# Patient Record
Sex: Male | Born: 1970 | Hispanic: Yes | Marital: Married | Smoking: Never smoker
Health system: Southern US, Community
[De-identification: ages and names within clinical notes are randomized; demographics above are authoritative.]

---

## 2009-05-03 ENCOUNTER — Emergency Department (HOSPITAL_COMMUNITY): Admission: EM | Admit: 2009-05-03 | Discharge: 2009-05-04 | Payer: Self-pay | Admitting: Emergency Medicine

## 2010-12-24 LAB — COMPREHENSIVE METABOLIC PANEL
ALT: 24 U/L (ref 0–53)
AST: 27 U/L (ref 0–37)
Albumin: 3.9 g/dL (ref 3.5–5.2)
CO2: 27 mEq/L (ref 19–32)
Chloride: 107 mEq/L (ref 96–112)
GFR calc Af Amer: >60 mL/min (ref >60)
GFR calc non Af Amer: >60 mL/min (ref >60)
Sodium: 141 mEq/L (ref 135–145)
Total Bilirubin: 1.2 mg/dL (ref 0.3–1.2)

## 2010-12-24 LAB — URINE MICROSCOPIC-ADD ON

## 2010-12-24 LAB — DIFFERENTIAL
Eosinophils Absolute: 1.1 10*3/uL — ABNORMAL HIGH (ref 0.0–0.7)
Eosinophils Relative: 14 % — ABNORMAL HIGH (ref 0–5)
Lymphocytes Relative: 25 % (ref 12–46)
Lymphs Abs: 2 10*3/uL (ref 0.7–4.0)
Monocytes Absolute: 0.5 10*3/uL (ref 0.1–1.0)

## 2010-12-24 LAB — URINALYSIS, ROUTINE W REFLEX MICROSCOPIC
Bilirubin Urine: NEGATIVE
Hgb urine dipstick: NEGATIVE
Ketones, ur: NEGATIVE mg/dL
Nitrite: NEGATIVE
Specific Gravity, Urine: 1.041 — ABNORMAL HIGH (ref 1.005–1.030)
pH: 5.5 (ref 5.0–8.0)

## 2010-12-24 LAB — CBC
Platelets: 227 10*3/uL (ref 150–400)
RBC: 4.59 MIL/uL (ref 4.22–5.81)
WBC: 8 10*3/uL (ref 4.0–10.5)

## 2010-12-24 LAB — LIPASE, BLOOD: Lipase: 22 U/L (ref 11–59)

## 2011-03-30 ENCOUNTER — Ambulatory Visit
Admission: RE | Admit: 2011-03-30 | Discharge: 2011-03-30 | Disposition: A | Discharge status: Home / Self Care | Payer: Self-pay | Source: Ambulatory Visit | Attending: Geriatric Medicine | Admitting: Geriatric Medicine

## 2011-03-30 ENCOUNTER — Other Ambulatory Visit: Payer: Self-pay | Admitting: Geriatric Medicine

## 2011-03-30 DIAGNOSIS — T1490XA Injury, unspecified, initial encounter: Secondary | ICD-10-CM

## 2015-02-12 ENCOUNTER — Encounter (HOSPITAL_COMMUNITY): Payer: Self-pay | Admitting: *Deleted

## 2015-02-12 ENCOUNTER — Emergency Department (HOSPITAL_COMMUNITY)
Admission: EM | Admit: 2015-02-12 | Discharge: 2015-02-12 | Disposition: A | Discharge status: Home / Self Care | Payer: Self-pay | Attending: Emergency Medicine | Admitting: Emergency Medicine

## 2015-02-12 DIAGNOSIS — S21159A Open bite of unspecified front wall of thorax without penetration into thoracic cavity, initial encounter: Secondary | ICD-10-CM | POA: Insufficient documentation

## 2015-02-12 DIAGNOSIS — Y990 Civilian activity done for income or pay: Secondary | ICD-10-CM | POA: Insufficient documentation

## 2015-02-12 DIAGNOSIS — Y9289 Other specified places as the place of occurrence of the external cause: Secondary | ICD-10-CM | POA: Insufficient documentation

## 2015-02-12 DIAGNOSIS — S2195XA Open bite of unspecified part of thorax, initial encounter: Secondary | ICD-10-CM

## 2015-02-12 DIAGNOSIS — W540XXA Bitten by dog, initial encounter: Secondary | ICD-10-CM | POA: Insufficient documentation

## 2015-02-12 DIAGNOSIS — Z23 Encounter for immunization: Secondary | ICD-10-CM | POA: Insufficient documentation

## 2015-02-12 DIAGNOSIS — Y9389 Activity, other specified: Secondary | ICD-10-CM | POA: Insufficient documentation

## 2015-02-12 MED ORDER — HYDROCODONE-ACETAMINOPHEN 5-325 MG PO TABS: 1.0000 | ORAL_TABLET | Freq: Four times a day (QID) | ORAL | Status: AC | PRN

## 2015-02-12 MED ORDER — OXYCODONE-ACETAMINOPHEN 5-325 MG PO TABS
1.0000 | ORAL_TABLET | Freq: Once | ORAL | Status: AC
  Administered 2015-02-12: 1 via ORAL
  Filled 2015-02-12: qty 1

## 2015-02-12 MED ORDER — TETANUS-DIPHTH-ACELL PERTUSSIS 5-2.5-18.5 LF-MCG/0.5 IM SUSP
0.5000 mL | Freq: Once | INTRAMUSCULAR | Status: AC
  Administered 2015-02-12: 0.5 mL via INTRAMUSCULAR
  Filled 2015-02-12: qty 0.5

## 2015-02-12 MED ORDER — AMOXICILLIN-POT CLAVULANATE 875-125 MG PO TABS: 1.0000 | ORAL_TABLET | Freq: Two times a day (BID) | ORAL | Status: AC

## 2015-02-12 NOTE — ED Provider Notes (Signed)
CSN: [id number]     Arrival date & time 02/12/15  1823 History  This chart was scribed for non-physician practitioner Felicie Mornavid Deserea Bordley, NP working with Tilden FossaElizabeth Rees, MD by Lyndel SafeKaitlyn Shelton, ED Scribe. This patient was seen in room TR08C/TR08C and the patient's care was started at 6:41 PM.  Chief Complaint  Patient presents with  . Animal Bite   Patient is a 44 y.o. male presenting with animal bite. The history is provided by a friend. The history is limited by a language barrier. No language interpreter was used.  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 male who presents to the Emergency Department complaining of a wound to his left upper chest s/p dog bite that occurred earlier today. 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 The co-worker reports that the dog also ripped his left pant leg. He notes that it was a nice residence and he suspects that the dog is UTD on vaccinations. The patient is unaware of his last tetanus shot. He denies a PMhx of DM, HTN, or any other chronic medical history per friend.   Per nurse, the dog owner suspects that the wound may be due to claws but the pt is adamant that it is a bite.   History reviewed. No pertinent past medical history. History reviewed. No pertinent past surgical history. No family history on file. History  Substance Use Topics  . Smoking status: Never Smoker   . Smokeless tobacco: Not on file    . Alcohol Use: Not on file    Review of Systems  Constitutional: Negative for fever.  Skin: Positive for wound.  All other systems reviewed and are negative.  Allergies  Review of patient's allergies indicates no known allergies.  Home Medications   Prior to Admission medications   Not on File   BP 138/84 mmHg  Pulse 81  Temp(Src) 98.4 F (36.9 C) (Oral)  Resp 20  SpO2 97%  Physical Exam  Constitutional: He is oriented to person, place, and time. He appears well-developed and well-nourished. No distress.  HENT:  Head: Normocephalic and atraumatic.  Eyes: Conjunctivae and EOM are normal.  Neck: Neck supple.  Cardiovascular: Normal rate.   Pulmonary/Chest: Breath sounds normal. No respiratory distress.  Neurological: He is alert and oriented to person, place, and time.  Skin: Skin is warm.  See photo  Psychiatric: His behavior is normal.  Nursing note and vitals reviewed.   ED Course  Procedures  DIAGNOSTIC STUDIES: Oxygen Saturation is 97% on RA, normal by my interpretation.    COORDINATION OF CARE: 6:49 PM Discussed treatment plan which includes to order pain medication and a Tdap injection with pt. Pt acknowledges and agrees to plan.   6:53 PM The home owners were contacted by Nurse, Shawna OrleansMelanie, and it was confirmed that the dog is UTD on all vaccinations.   Labs Review Labs Reviewed - No data  to display  Imaging Review No results found.   EKG Interpretation None         MDM   Final diagnoses:  None    Dog bite to left anterior chest. Owner of dog reports animal's vaccinations are up to date. Animal control referral made.  Wound care. Tetanus updated. Augmentin. Return precautions discussed.  I personally performed the services described in this documentation, which was scribed in my presence. The recorded information has been reviewed and is accurate.    Felicie Morn, NP 02/13/15 0158  Tilden Fossa, MD 02/22/15 [phone]

## 2015-02-12 NOTE — ED Notes (Signed)
This RN spoke to owner of the dog and she states the dog is up to date on all vaccines, including rabies and bordetella.

## 2015-02-12 NOTE — ED Notes (Signed)
Pt noted to have a dog bite to left upper chest. Bleeding controlled at this time. VSS. 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 Bilateral breath sounds present with equal expansion. NAD noted.

## 2015-02-12 NOTE — Discharge Instructions (Signed)
Animal Bite °An animal bite can result in a scratch on the skin, deep open cut, puncture of the skin, crush injury, or tearing away of the skin or a body part. Dogs are responsible for most animal bites. Children are bitten more often than adults. An animal bite can range from very mild to more serious. A small bite from your house pet is no cause for alarm. However, some animal bites can become infected or injure a bone or other tissue. You must seek medical care if: °· The skin is broken and bleeding does not slow down or stop after 15 minutes. °· The puncture is deep and difficult to clean (such as a cat bite). °· Pain, warmth, redness, or pus develops around the wound. °· The bite is from a stray animal or rodent. There may be a risk of rabies infection. °· The bite is from a snake, raccoon, skunk, fox, coyote, or bat. There may be a risk of rabies infection. °· The person bitten has a chronic illness such as diabetes, liver disease, or cancer, or the person takes medicine that lowers the immune system. °· There is concern about the location and severity of the bite. °It is important to clean and protect an animal bite wound right away to prevent infection. Follow these steps: °· Clean the wound with plenty of water and soap. °· Apply an antibiotic cream. °· Apply gentle pressure over the wound with a clean towel or gauze to slow or stop bleeding. °· Elevate the affected area above the heart to help stop any bleeding. °· Seek medical care. Getting medical care within 8 hours of the animal bite leads to the best possible outcome. °DIAGNOSIS  °Your caregiver will most likely: °· Take a detailed history of the animal and the bite injury. °· Perform a wound exam. °· Take your medical history. °Blood tests or X-rays may be performed. Sometimes, infected bite wounds are cultured and sent to a lab to identify the infectious bacteria.  °TREATMENT  °Medical treatment will depend on the location and type of animal bite as  well as the patient's medical history. Treatment may include: °· Wound care, such as cleaning and flushing the wound with saline solution, bandaging, and elevating the affected area. °· Antibiotics. °· Tetanus immunization. °· Rabies immunization. °· Leaving the wound open to heal. This is often done with animal bites, due to the high risk of infection. However, in certain cases, wound closure with stitches, wound adhesive, skin adhesive strips, or staples may be used. ° Infected bites that are left untreated may require intravenous (IV) antibiotics and surgical treatment in the hospital. °HOME CARE INSTRUCTIONS °· Follow your caregiver's instructions for wound care. °· Take all medicines as directed. °· If your caregiver prescribes antibiotics, take them as directed. Finish them even if you start to feel better. °· Follow up with your caregiver for further exams or immunizations as directed. °You may need a tetanus shot if: °· You cannot remember when you had your last tetanus shot. °· You have never had a tetanus shot. °· The injury broke your skin. °If you get a tetanus shot, your arm may swell, get red, and feel warm to the touch. This is common and not a problem. If you need a tetanus shot and you choose not to have one, there is a rare chance of getting tetanus. Sickness from tetanus can be serious. °SEEK MEDICAL CARE IF: °· You notice warmth, redness, soreness, swelling, pus discharge, or a bad   smell coming from the wound.  You have a red line on the skin coming from the wound.  You have a fever, chills, or a general ill feeling.  You have nausea or vomiting.  You have continued or worsening pain.  You have trouble moving the injured part.  You have other questions or concerns. MAKE SURE YOU:  Understand these instructions.  Will watch your condition.  Will get help right away if you are not doing well or get worse. Document Released: 05/23/2011 Document Revised: 11/27/2011 Document  Reviewed: 05/23/2011 [facility] Patient Information 2015 [place], [place]. This information is not intended to replace advice given to you by your health care provider. Make sure you discuss any questions you have with your health care provider.  TAKE THE ANTIBIOTIC AS DIRECTED. MONITOR FOR SIGNS OF INFECTION AS LISTED IN THE DISCHARGE INSTRUCTIONS.  RETURN TO THE ED IF SIGNS OF INFECTION DEVELOP.  Mordedura de Engineer, maintenanceanimales  (LobbyistAnimal Bite) La mordedura de Corporate investment bankerun animal puede producir un rasguo de la piel, un corte abierto profundo, una puncin, una lesin por compresin o un desgarro de la piel o de una parte del cuerpo. Los perros son los responsables de la mayora de las mordeduras. Los nios son atacados con ms frecuencia que los adultos. La mordedura de un animal puede ser leve o llegar a ser grave. Una mordedura pequea causada por una mascota no es causa de alarma. Sin embargo, algunas pueden infectarse o llegar a Engineer, drillinglesionar un hueso u otros tejidos. Debe solicitar asistencia mdica si:   La piel est abierta y el sangrado no se detiene ni disminuye despus de 15 minutos.  La puncin es profunda y difcil de limpiar (como en el caso de la mordedura de un Quilcenegato).  La herida le duele, est caliente, roja o supura pus.  La mordedura la hizo un Haematologistanimal callejero o un roedor. Puede haber riesgo de infeccin por rabia.  La mordedura la hizo una serpiente, un mapache, zorrino, Chief Financial Officerzorro, Tour managercoyote o murcilago. Puede haber riesgo de infeccin por rabia.  La persona que sufri la mordedura tiene una enfermedad crnica como diabetes, enfermedad heptica o cncer, o toma medicamentos que disminuyen la accin del sistema inmunolgico.  Hay preocupacin por la ubicacin y la gravedad de la mordedura. Es importante limpiar y proteger la zona de la herida inmediatamente, para evitar la infeccin. Siga estos pasos:   Limpie la herida con abundante agua y [place].  Baruch Goutyubra con una crema con antibitico.  Aplique una suave  presin sobre la herida con una toalla o una gasa limpia para disminuir o detener el sangrado.  Eleve la zona afectada por arriba del nivel del corazn para Associate Professordetener hemorragias.  Pida ayuda mdica. Si recibe asistencia mdica dentro de las 8 horas de la Entergy Corporationmordedura los resultados sern mejores. DIAGNSTICO  El mdico:   Tomar una historia detallada del animal y de la lesin por la mordedura.  Har un examen de la herida.  Realizar una historia clnica. Indicar anlisis o radiografas. En algunos casos se toma una muestra de la herida infectada y se enva al laboratorio para identificar la bacteria que produjo la infeccin.  TRATAMIENTO  El tratamiento mdico depender de la ubicacin y el tipo de mordedura, as como de la historia clnica del Flemingpaciente. El tratamiento podr incluir:   Cuidados de la herida, como limpieza y enjuague con solucin fisiolgica, vendaje y la elevacin de la zona afectada.  Antibiticos.  Vacuna antitetnica.  Madilyn FiremanVacuna antirrbica.  Dejar la herida abierta para  que se cure. Generalmente sto se hace debido al riesgo de infeccin. Sin embargo, en ciertos casos, la herida se cierra con puntos, Turner Daniels para heridas, tiras ZOXWRUEAV para la piel o grapas. Las heridas infectadas que no se tratan pueden requerir antibiticos por va intravenosa (IV) y tratamiento quirrgico en el hospital.  INSTRUCCIONES PARA EL CUIDADO EN EL HOGAR   Siga las indicaciones del profesional para el cuidado de las heridas.  [employer] tal como se los han indicado.  Si el profesional que lo asiste le prescribe antibiticos, tmelos tal como se le indic. Tmelos todos, aunque se sienta mejor.  Concurra a las visitas de control con el mdico para Wellsite geologist, o recibir vacunas, segn las indicaciones. Deber aplicarse la vacuna contra el ttanos si:  No recuerda cundo se coloc la vacuna la ltima vez.  Nunca recibi esta vacuna.  La lesin  ha [employer]. Si le han aplicado la vacuna contra el ttanos, el brazo podr hincharse, enrojecer y sentirse caliente al tacto. Esto es frecuente y no es un problema. Si usted necesita aplicarse la vacuna y se niega a recibirla, corre riesgo de contraer ttanos. Esta es una enfermedad que puede ser grave. SOLICITE ATENCIN MDICA SI:   La herida est caliente, roja, hinchada, le duele, supura pus o tiene [employer].  Hay una lnea roja en la piel que sale desde la herida.  Tiene fiebre, escalofros, o una sensacin general de Dentist.  Tiene nuseas o vmitos.  Siente dolor continuo o que [place].  Tiene dificultad para mover la zona lesionada.  Tiene otras preguntas o preocupaciones. ASEGRESE DE QUE:   Comprende estas instrucciones.  Controlar su enfermedad.  Solicitar ayuda de inmediato si no mejora o si empeora. Document Released: 08/24/2011 Document Revised: 11/27/2011 [facility] Patient Information 2015 [place], [place]. This information is not intended to replace advice given to you by your health care provider. Make sure you discuss any questions you have with your health care provider.

## 2015-03-11 ENCOUNTER — Emergency Department (HOSPITAL_COMMUNITY)
Admission: EM | Admit: 2015-03-11 | Discharge: 2015-03-11 | Disposition: A | Discharge status: Home / Self Care | Payer: Self-pay | Attending: Emergency Medicine | Admitting: Emergency Medicine

## 2015-03-11 ENCOUNTER — Emergency Department (HOSPITAL_COMMUNITY): Payer: Self-pay

## 2015-03-11 ENCOUNTER — Encounter (HOSPITAL_COMMUNITY): Payer: Self-pay | Admitting: Emergency Medicine

## 2015-03-11 DIAGNOSIS — Z5189 Encounter for other specified aftercare: Secondary | ICD-10-CM

## 2015-03-11 DIAGNOSIS — Z792 Long term (current) use of antibiotics: Secondary | ICD-10-CM | POA: Insufficient documentation

## 2015-03-11 DIAGNOSIS — Z4800 Encounter for change or removal of nonsurgical wound dressing: Secondary | ICD-10-CM | POA: Insufficient documentation

## 2015-03-11 DIAGNOSIS — R739 Hyperglycemia, unspecified: Secondary | ICD-10-CM | POA: Insufficient documentation

## 2015-03-11 LAB — CBG MONITORING, ED: GLUCOSE-CAPILLARY: 150 mg/dL — AB (ref 65–99)

## 2015-03-11 NOTE — Discharge Instructions (Signed)
Read the information below.  You may return to the Emergency Department at any time for worsening condition or any new symptoms that concern you.  If you develop redness, swelling, pus draining from the wound, or fevers greater than 100.4, return to the ER immediately for a recheck.    Lea la siguiente informacin . Usted puede volver a la sala de urgencias en cualquier momento por cualquier condicin o nuevos sntomas que le preocupan empeoramiento . Si presenta enrojecimiento , inflamacin , secrecin de pus de la herida , o fiebre de ms de 100.4 , volver a la sala de emergencia de inmediato de volver a Ecologist .

## 2015-03-11 NOTE — ED Notes (Signed)
Pt speaks Spanish and sts would like recheck of dog bite to left chest

## 2015-03-11 NOTE — ED Provider Notes (Signed)
CSN: [id number]     Arrival date & time 03/11/15  1810 History  This chart was scribed for non-physician practitioner,Giulia Hickey Biagio Borg, working with Benjiman Core, MD, by Budd Palmer ED Scribe. This patient was seen in room TR09C/TR09C and the patient's care was started at 6:44 PM    Chief Complaint  Patient presents with  . Wound Check   The history is provided by the patient. No language interpreter was used.   HPI Comments: Joseph Hunter is a 44 y.o. 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 He is still experiencing associated pain internally where the bite occurred, which is why he came to the ED today.  He has returned to find out if he is able to work again.  He is taking naproxin with relief. He denies fever, SOB, CP, purulent discharge, bleeding from the wound.  He also would like to be checked for blood pressure and diabetes because he doesn't have a PCP.    History reviewed. No pertinent past medical history. History reviewed. No pertinent past surgical history. History reviewed. No pertinent family history. History  Substance Use Topics  . Smoking status: Never Smoker   . Smokeless tobacco: Not on file  . Alcohol Use: Not on file    Review of Systems  Constitutional: Negative for fever and chills.  Respiratory: Negative for cough and shortness of breath.   Cardiovascular: Negative for chest pain.  Musculoskeletal: Negative for myalgias.  Skin: Positive for wound. Negative for color change and pallor.  Allergic/Immunologic: Negative for immunocompromised state.  Hematological: Does not bruise/bleed easily.  Psychiatric/Behavioral: Negative for self-injury.    Allergies  Review of patient's allergies indicates no known allergies.  Home Medications   Prior to Admission medications   Medication Sig Start Date End Date Taking?  Authorizing Provider  amoxicillin-clavulanate (AUGMENTIN) 875-125 MG per tablet Take 1 tablet by mouth 2 (two) times daily. One po bid x 7 days 02/12/15   Felicie Morn, NP  HYDROcodone-acetaminophen (NORCO/VICODIN) 5-325 MG per tablet Take 1 tablet by mouth every 6 (six) hours as needed for severe pain. 02/12/15   Felicie Morn, NP   BP 132/74 mmHg  Pulse 85  Temp(Src) 98.6 F (37 C) (Oral)  Resp 18  SpO2 95% Physical Exam  Constitutional: He appears well-developed and well-nourished. No distress.  HENT:  Head: Normocephalic and atraumatic.  Neck: Neck supple.  Cardiovascular: Normal rate and regular rhythm.   Pulmonary/Chest: Effort normal and breath sounds normal. No respiratory distress. He has no wheezes. He has no rales.    Neurological: He is alert.  Skin: He is not diaphoretic.  Psychiatric: He has a normal mood and affect. His behavior is normal.  Nursing note and vitals reviewed.   ED Course  Procedures  DIAGNOSTIC STUDIES: Oxygen Saturation is 95% on RA, adequate by my interpretation.    COORDINATION OF CARE: 6:50 PM - Discussed plans to perform diagnostic studies. Pt advised of plan for treatment and pt agrees.  Labs Review Labs Reviewed  CBG MONITORING, ED - Abnormal; Notable for the following:    Glucose-Capillary 150 (*)    All other components within normal limits    Imaging Review Dg Chest 2 View  03/11/2015   CLINICAL DATA:  Dog bite on left chest 1 month ago.  EXAM: CHEST  2 VIEW  COMPARISON:  None.  FINDINGS:  The heart size and mediastinal contours are within normal limits. Both lungs are clear. No pneumothorax or pleural effusion is noted. The visualized skeletal structures are unremarkable.  IMPRESSION: No active cardiopulmonary disease.   Electronically Signed   By: Lupita Raider, M.D.   On: 03/11/2015 19:25     EKG Interpretation None      MDM   Final diagnoses:  Visit for wound check  Hyperglycemia    Afebrile, nontoxic patient with wound  recheck from dog bite approximately 1 months ago.  Well healed. CXR ordered as pt has persistent pain and xray was not done initially - xray negative.  Pt is hyperglycemic, has been eating today.  Referral to [facility].   D/C home.  Discussed result, findings, treatment, and follow up  with patient.  Pt given return precautions.  Pt verbalizes understanding and agrees with plan.       I personally performed the services described in this documentation, which was scribed in my presence. The recorded information has been reviewed and is accurate.   Trixie Dredge, PA-C 03/11/15 2044  Benjiman Core, MD 03/14/15 1539

## 2015-03-11 NOTE — ED Notes (Signed)
Pt returned for recheck of dog bite to left chest on 5/27.  Wound appears healed with no signs of infection

## 2015-07-16 LAB — GLUCOSE, POCT (MANUAL RESULT ENTRY): POC GLUCOSE: 107 mg/dL — AB (ref 70–99)

## 2015-07-21 ENCOUNTER — Ambulatory Visit: Payer: Self-pay | Attending: Family Medicine

## 2019-12-24 ENCOUNTER — Emergency Department (HOSPITAL_COMMUNITY)
Admission: EM | Admit: 2019-12-24 | Discharge: 2019-12-25 | Disposition: A | Discharge status: Home / Self Care | Payer: Self-pay | Attending: Emergency Medicine | Admitting: Emergency Medicine

## 2019-12-24 ENCOUNTER — Other Ambulatory Visit: Payer: Self-pay

## 2019-12-24 ENCOUNTER — Encounter (HOSPITAL_COMMUNITY): Payer: Self-pay | Admitting: *Deleted

## 2019-12-24 DIAGNOSIS — Y939 Activity, unspecified: Secondary | ICD-10-CM | POA: Insufficient documentation

## 2019-12-24 DIAGNOSIS — Y999 Unspecified external cause status: Secondary | ICD-10-CM | POA: Insufficient documentation

## 2019-12-24 DIAGNOSIS — Y929 Unspecified place or not applicable: Secondary | ICD-10-CM | POA: Insufficient documentation

## 2019-12-24 DIAGNOSIS — S0083XA Contusion of other part of head, initial encounter: Secondary | ICD-10-CM | POA: Insufficient documentation
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
## 2019-12-24 NOTE — ED Triage Notes (Signed)
The pt has bruising and swelling to the rt side of his face his nose has dried blood and he has dried blood in his rt ear

## 2019-12-25 ENCOUNTER — Emergency Department (HOSPITAL_COMMUNITY): Payer: Self-pay
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
## 2019-12-25 NOTE — ED Notes (Signed)
Transported to CT scan
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
